# Patient Record
Sex: Female | Born: 1965 | Race: White | Hispanic: No | Marital: Married | Smoking: Never smoker
Health system: Southern US, Community
[De-identification: ages and names within clinical notes are randomized; demographics above are authoritative.]

## PROBLEM LIST (undated history)

## (undated) DIAGNOSIS — H6690 Otitis media, unspecified, unspecified ear: Secondary | ICD-10-CM

## (undated) HISTORY — DX: Otitis media, unspecified, unspecified ear: H66.90

---
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
## 1993-11-04 HISTORY — PX: TUBAL LIGATION: SHX77

## 1998-11-04 HISTORY — PX: LIPOSUCTION: SHX10

## 1999-11-05 HISTORY — PX: ABDOMINAL HYSTERECTOMY: SHX81

## 2010-05-11 ENCOUNTER — Ambulatory Visit: Payer: Self-pay | Admitting: Family Medicine

## 2010-05-11 DIAGNOSIS — H66009 Acute suppurative otitis media without spontaneous rupture of ear drum, unspecified ear: Secondary | ICD-10-CM | POA: Insufficient documentation

## 2010-05-11 DIAGNOSIS — H60339 Swimmer's ear, unspecified ear: Secondary | ICD-10-CM | POA: Insufficient documentation

## 2010-06-13 ENCOUNTER — Ambulatory Visit: Payer: Self-pay | Admitting: Emergency Medicine

## 2010-12-04 NOTE — Assessment & Plan Note (Signed)
Summary: EARACHE/KH   Vital Signs:  Patient Profile:   45 Years Old Female CC:      right ear pain X 3 days Height:     67 inches Weight:      146 pounds O2 Sat:      100 % O2 treatment:    Room Air Temp:     98.6 degrees F oral Pulse rate:   75 / minute Resp:     14 per minute BP sitting:   121 / 70  (right arm) Cuff size:   regular  Pt. in pain?   yes    Location:   right ear    Type:       sharp  Vitals Entered By: Lajean Saver, RN                    Updated Prior Medication List: No Medications Current Allergies: ! SULFAHistory of Present Illness Chief Complaint: right ear pain X 3 days History of Present Illness:  Subjective:  Patient complains of right earache for 3 days.  No other symptoms.  She has been swimming frequently.  REVIEW OF SYSTEMS Constitutional Symptoms      Denies fever, chills, night sweats, weight loss, weight gain, and fatigue.  Eyes       Denies change in vision, eye pain, eye discharge, glasses, contact lenses, and eye surgery. Ear/Nose/Throat/Mouth       Complains of ear pain.      Denies hearing loss/aids, change in hearing, ear discharge, dizziness, frequent runny nose, frequent nose bleeds, sinus problems, sore throat, hoarseness, and tooth pain or bleeding.      Comments: right ear Respiratory       Denies dry cough, productive cough, wheezing, shortness of breath, asthma, bronchitis, and emphysema/COPD.  Cardiovascular       Denies murmurs, chest pain, and tires easily with exhertion.    Gastrointestinal       Denies stomach pain, nausea/vomiting, diarrhea, constipation, blood in bowel movements, and indigestion. Genitourniary       Denies painful urination, kidney stones, and loss of urinary control. Neurological       Denies paralysis, seizures, and fainting/blackouts. Musculoskeletal       Denies muscle pain, joint pain, joint stiffness, decreased range of motion, redness, swelling, muscle weakness, and gout.  Skin        Denies bruising, unusual mles/lumps or sores, and hair/skin or nail changes.  Psych       Denies mood changes, temper/anger issues, anxiety/stress, speech problems, depression, and sleep problems.  Past History:  Past Medical History: Hx of bacteria to right ear causing blisters to ear drum- Seen by ENT  Past Surgical History: Tubal ligation 1994 Hysterectomy 2001  Family History: na  Social History: Married Never Smoked Alcohol use-no Drug use-no Smoking Status:  never Drug Use:  no   Objective:  No acute distress  Eyes:  Pupils are equal, round, and reactive to light and accomdation.  Extraocular movement is intact.  Conjunctivae are not inflamed.  Ears:  Left external auditory canal and tympanic membrane normal.  Right external auditory canal is erythematous and mildly edematous.  Right tympanic membrane is erythematous with decreased landmarks. Nose:  Minimal congestion.  No sinus tenderness Pharynx:  Normal  Neck:  Supple.  No adenopathy is present.   Assessment New Problems: OTITIS EXTERNA, ACUTE, RIGHT (ICD-380.12) OTITIS MEDIA, SUPPURATIVE, ACUTE, RIGHT (ICD-382.00)   Plan New Medications/Changes: CIPRODEX 0.3-0.1 % SUSP (CIPROFLOXACIN-DEXAMETHASONE) 4 gtts  in affected ear two times a day for 7 days  #7.5cc x 0, 05/11/2010, Donna Christen MD AMOXICILLIN 875 MG TABS (AMOXICILLIN) One by mouth two times a day  #20 x 0, 05/11/2010, Donna Christen MD  New Orders: New Patient Level III [phone] Planning Comments:   Begin oral amoxicillin and Ciprodex ear drops.  Avoid swimming until resolved. Follow-up with PCP if not resolving.   The patient and/or caregiver has been counseled thoroughly with regard to medications prescribed including dosage, schedule, interactions, rationale for use, and possible side effects and they verbalize understanding.  Diagnoses and expected course of recovery discussed and will return if not improved as expected or if the condition  worsens. Patient and/or caregiver verbalized understanding.  Prescriptions: CIPRODEX 0.3-0.1 % SUSP (CIPROFLOXACIN-DEXAMETHASONE) 4 gtts in affected ear two times a day for 7 days  #7.5cc x 0   Entered and Authorized by:   Donna Christen MD   Signed by:   Donna Christen MD on 05/11/2010   Method used:   Print then Give to Patient   RxID:   [id number] AMOXICILLIN 875 MG TABS (AMOXICILLIN) One by mouth two times a day  #20 x 0   Entered and Authorized by:   Donna Christen MD   Signed by:   Donna Christen MD on 05/11/2010   Method used:   Print then Give to Patient   RxID:   [id number]   Orders Added: 1)  New Patient Level III [07371]

## 2010-12-04 NOTE — Assessment & Plan Note (Signed)
Summary: T-dap Vaccine  Nurse Visit   Allergies: 1)  ! Sulfa  Immunizations Administered:  Tetanus Vaccine:    Vaccine Type: Tdap    Site: right deltoid    Mfr: Sanofi Pasteur    Dose: 0.5 ml    Route: IM    Given by: Lajean Saver RN    Exp. Date: 12/06/2011    Lot #: [id number]    VIS given: 09/22/07 version given June 13, 2010.  Orders Added: 1)  Tdap => 76yrs IM [90715] 2)  Admin 1st Vaccine [90471]   Immunizations Administered:  Tetanus Vaccine:    Vaccine Type: Tdap    Site: right deltoid    Mfr: Sanofi Pasteur    Dose: 0.5 ml    Route: IM    Given by: Lajean Saver RN    Exp. Date: 12/06/2011    Lot #: [id number]    VIS given: 09/22/07 version given June 13, 2010.  Vaccine Consent Questions for Tetanus:    Do you have a history of severe allergic reactions to this vaccine? no    Any prior history of allergic reactions to egg and/or gelatin? no    Do you currently have an acute febrile illness? no    Have you ever had a severe reaction to latex? no    Patient is moderately or severely ill? no    Vaccine information given and explained to patient? no    Are you currently pregnant? no  Appended Document: T-dap Vaccine Vaccine information given to and explained to patient. KL

## 2011-08-04 ENCOUNTER — Encounter: Payer: Self-pay | Admitting: Family Medicine

## 2011-08-09 ENCOUNTER — Encounter: Payer: Self-pay | Admitting: Family Medicine

## 2011-08-09 ENCOUNTER — Ambulatory Visit (INDEPENDENT_AMBULATORY_CARE_PROVIDER_SITE_OTHER): Payer: BC Managed Care – PPO | Admitting: Family Medicine

## 2011-08-09 VITALS — BP 136/83 | HR 87 | Ht 67.0 in | Wt 145.0 lb

## 2011-08-09 DIAGNOSIS — R030 Elevated blood-pressure reading, without diagnosis of hypertension: Secondary | ICD-10-CM

## 2011-08-09 DIAGNOSIS — L709 Acne, unspecified: Secondary | ICD-10-CM

## 2011-08-09 DIAGNOSIS — L708 Other acne: Secondary | ICD-10-CM

## 2011-08-09 MED ORDER — TRETINOIN 0.1 % EX CREA: TOPICAL_CREAM | Freq: Every day | CUTANEOUS | Status: AC

## 2011-08-09 NOTE — Patient Instructions (Signed)
Call when ready to check your cholesterol.

## 2011-08-09 NOTE — Progress Notes (Addendum)
Subjective:    Patient ID: Charlotte Bartlett, female    DOB: [birth year], 45 y.o.   MRN: [id number]  HPI  Had a rx for retin-A for her skin. She has very oily skin and breaks out. Would like a new rx  Has been really stressed at work. Says will need a work note for this today.  Says this has been disturbing your sleep.  Says it is a negative environment. She wanted a note to that she felt sick and she cannot work now diminishing her may not need to come to the doctor's office for actual care. If she doesn't have ordered and she will be denied payment for her sick day.  No prior hx of BP.   Review of Systems  Constitutional: Negative for fever, diaphoresis and unexpected weight change.  HENT: Negative for hearing loss, rhinorrhea and tinnitus.        Change in vision  Eyes: Negative for visual disturbance.  Respiratory: Negative for cough and wheezing.   Cardiovascular: Negative for chest pain and palpitations.  Gastrointestinal: Negative for nausea, vomiting, diarrhea and blood in stool.  Genitourinary: Negative for vaginal bleeding, vaginal discharge and difficulty urinating.  Musculoskeletal: Negative for myalgias and arthralgias.  Skin: Negative for rash.  Neurological: Negative for headaches.  Hematological: Negative for adenopathy. Does not bruise/bleed easily.  Psychiatric/Behavioral: Negative for sleep disturbance and dysphoric mood. The patient is not nervous/anxious.        BP 136/83  Pulse 87  Ht 5\' 7"  (1.702 m)  Wt 145 lb (65.772 kg)  BMI 22.71 kg/m2  SpO2 100%    Allergies  Allergen Reactions  . Sulfonamide Derivatives Rash    Past Medical History  Diagnosis Date  . Acute bacterial middle ear infection     blisters to eardrum    Past Surgical History  Procedure Date  . Tubal ligation 1995  . Abdominal hysterectomy 2001    Partial   . Ganglion cyst excision 1980    Left   . Liposuction 2000    History   Social History  . Marital Status: Married      Spouse Name: Charlotte Bartlett    Number of Children: 2   . Years of Education: N/A   Occupational History  . Teacher/model [employer]    Social History Main Topics  . Smoking status: Never Smoker   . Smokeless tobacco: Not on file  . Alcohol Use: No  . Drug Use: No  . Sexually Active: Yes     married   Other Topics Concern  . Not on file   Social History Narrative   Regular exercise. 2 caffeine drinks per day.     Family History  Problem Relation Age of Onset  . Breast cancer Maternal Grandmother     Ms. Dickison does not currently have medications on file.  Objective:   Physical Exam  Constitutional: She is oriented to person, place, and time. She appears well-developed and well-nourished.  HENT:  Head: Normocephalic and atraumatic.  Eyes: Conjunctivae are normal. Pupils are equal, round, and reactive to light.  Neck: Neck supple. No thyromegaly present.  Cardiovascular: Normal rate, regular rhythm and normal heart sounds.   Pulmonary/Chest: Effort normal and breath sounds normal.  Musculoskeletal: She exhibits no edema.  Lymphadenopathy:    She has no cervical adenopathy.  Neurological: She is alert and oriented to person, place, and time.  Skin: Skin is warm and dry.  She does have large pores a few fine papules on her face. No cystic lesions. Overall her skin looks fairly good today. No significant sun damage  Psychiatric: She has a normal mood and affect. Her behavior is normal.          Assessment & Plan:  Elevated BP - 3 recheck her blood pressure was a little bit better. We will keep an eye on this. Encouraged her to a low salt diet. Chart he exercises regularly.    Acne - Will refil lher Retin-A. Let her know may need ot be prior authorized so recommend call pharm first to see if ready.   She is due for cholesterol screen as well. Last check was 5 years ago. She wasn't to hold on this for now. Says when check in the  summer when has time off from school.   Sick time-her know that usually we don't write notes for work unless they are seen but in this case I do understand the difficult situation that she is in and as long as it is not frequent albuterol and to write a note saying that she did contact her office and that she reported that she was out sick.

## 2011-08-28 ENCOUNTER — Telehealth: Payer: Self-pay | Admitting: Family Medicine

## 2011-08-28 NOTE — Telephone Encounter (Signed)
Pt called and said her retin A has to be prior authorized and she is calling to find out where we are with that. Plan:  Told pt do not see any notes in the chart where it is saying prior auth done.  Will have to ask clinical staff and or call the pharm to get them to send the prior auth form. Jarvis Newcomer, LPN [facility]

## 2011-08-29 ENCOUNTER — Telehealth: Payer: Self-pay | Admitting: Family Medicine

## 2011-08-29 NOTE — Telephone Encounter (Signed)
Called [facility] for prior auth for medication (retin A) 0.1% cream. Plan:  Spoke with [facility] prior [employer].  Prior auth approval obtained starting 08-08-2011----08-28-2012.  Case # [id number].  Faxed prior approval to [facility].  Pt's husband notified that prior auth approved for the tretinoin 1% cream. Jarvis Newcomer, LPN [facility]

## 2012-08-27 ENCOUNTER — Ambulatory Visit: Payer: BC Managed Care – PPO | Admitting: Family Medicine

## 2013-04-01 ENCOUNTER — Ambulatory Visit (INDEPENDENT_AMBULATORY_CARE_PROVIDER_SITE_OTHER): Payer: BC Managed Care – PPO | Admitting: Family Medicine

## 2013-04-01 ENCOUNTER — Encounter: Payer: Self-pay | Admitting: Family Medicine

## 2013-04-01 ENCOUNTER — Telehealth: Payer: Self-pay | Admitting: *Deleted

## 2013-04-01 ENCOUNTER — Ambulatory Visit (INDEPENDENT_AMBULATORY_CARE_PROVIDER_SITE_OTHER): Payer: BC Managed Care – PPO

## 2013-04-01 VITALS — BP 112/69 | HR 91 | Temp 98.6°F | Wt 137.0 lb

## 2013-04-01 DIAGNOSIS — R1031 Right lower quadrant pain: Secondary | ICD-10-CM

## 2013-04-01 DIAGNOSIS — R1032 Left lower quadrant pain: Secondary | ICD-10-CM

## 2013-04-01 DIAGNOSIS — N839 Noninflammatory disorder of ovary, fallopian tube and broad ligament, unspecified: Secondary | ICD-10-CM

## 2013-04-01 DIAGNOSIS — R1900 Intra-abdominal and pelvic swelling, mass and lump, unspecified site: Secondary | ICD-10-CM

## 2013-04-01 DIAGNOSIS — R188 Other ascites: Secondary | ICD-10-CM

## 2013-04-01 DIAGNOSIS — R300 Dysuria: Secondary | ICD-10-CM

## 2013-04-01 LAB — CBC WITH DIFFERENTIAL/PLATELET
Basophils Absolute: 0 10*3/uL (ref 0.0–0.1)
Lymphocytes Relative: 11 % — ABNORMAL LOW (ref 12–46)
Lymphs Abs: 1.4 10*3/uL (ref 0.7–4.0)
MCV: 95.4 fL (ref 78.0–100.0)
Neutro Abs: 10.6 10*3/uL — ABNORMAL HIGH (ref 1.7–7.7)
Neutrophils Relative %: 78 % — ABNORMAL HIGH (ref 43–77)
Platelets: 195 10*3/uL (ref 150–400)
RBC: 4.62 MIL/uL (ref 3.87–5.11)
WBC: 13.6 10*3/uL — ABNORMAL HIGH (ref 4.0–10.5)

## 2013-04-01 LAB — POCT URINALYSIS DIPSTICK
Blood, UA: NEGATIVE
Glucose, UA: NEGATIVE
Ketones, UA: NEGATIVE
Spec Grav, UA: 1.015
Urobilinogen, UA: 0.2

## 2013-04-01 MED ORDER — IOHEXOL 300 MG/ML  SOLN
100.0000 mL | Freq: Once | INTRAMUSCULAR | Status: AC | PRN
  Administered 2013-04-01: 100 mL via INTRAVENOUS

## 2013-04-01 MED ORDER — HYDROCODONE-ACETAMINOPHEN 5-325 MG PO TABS: 1.0000 | ORAL_TABLET | Freq: Four times a day (QID) | ORAL | Status: DC | PRN

## 2013-04-01 NOTE — Progress Notes (Signed)
CC: [name] is a 47 y.o. female is here for lower abd pain   Subjective: HPI:  Patient complains of pain in the lower abdomen that has been present since Monday of this week worsening on a daily basis moderate to severe upon waking this morning. Localized at the [facility] and left lower quadrant and right lower quadrant. Associated with a fullness. Described as a burning sensation that radiates into the back. She has some dysuria the past 24 hours but pain is still present without voiding. Pain is worse when crunching or twisting, hitting potholes in the car, shaking her stomach, or pressing on the lower abdomen. She reports bowels are still moving once a day but have become much more loose without blood or tar-like appearance. She feels fatigued but denies chills or fevers, she does admit to nausea and significant appetite loss.  Nothing makes pain better or worse other than above, pain is not related to bowel movements habits. Symptoms are present all hours of the day somewhat interfere with sleep. History of hysterectomy with no recent vaginal bleeding or discharge nor irritation.  Denies abdominal pain elsewhere, flank pain, change in the odor color or consistency of urine, lymph node swelling, bruising, shortness of breath, cough, chest pain, nor family history of inflammatory bowel disease    Review Of Systems Outlined In HPI  Past Medical History  Diagnosis Date  . Acute bacterial middle ear infection     blisters to eardrum     Family History  Problem Relation Age of Onset  . Breast cancer Maternal Grandmother      History  Substance Use Topics  . Smoking status: Never Smoker   . Smokeless tobacco: Not on file  . Alcohol Use: No     Objective: Filed Vitals:   04/01/13 0912  BP: 112/69  Pulse: 91  Temp: 98.6 F (37 C)    General: Alert and Oriented, No Acute Distress HEENT: Pupils equal, round, reactive to light. Conjunctivae clear.  Moist mucous  membranes Lungs: Clear to auscultation bilaterally, no wheezing/ronchi/rales.  Comfortable work of breathing. Good air movement. Cardiac: Regular rate and rhythm. Normal S1/S2.  No murmurs, rubs, nor gallops.   Abdomen: Soft without palpable masses. There is guarding with palpation of navel and right and left lower quadrant, she has rebound with palpation of right and left lower quadrant, heel strike reproduces pain, psoas sign negative on the right. Normal bowel sounds Extremities: No peripheral edema.  Strong peripheral pulses.  Mental Status: No depression, anxiety, nor agitation. Skin: Warm and dry.  Assessment & Plan: Charlotte Bartlett was seen today for lower abd pain.  Diagnoses and associated orders for this visit:  Dysuria - Urinalysis Dipstick - Urine Culture  Right lower quadrant pain - CT Abdomen Pelvis W Contrast; Future - CBC with Differential  Abdominal pain, left lower quadrant - CT Abdomen Pelvis W Contrast; Future - CBC with Differential    Discussed with patient that given rebound and guarding there is a concern for diverticulitis versus appendicitis versus colitis that would warrant CT scan today to rule out. She will be having a scan done at 12:00 this afternoon. Urinalysis is normal with low suspicion of genitourinary etiology pain  Return if symptoms worsen or fail to improve.

## 2013-04-01 NOTE — Telephone Encounter (Signed)
Prior auth obtained for CT abdomin and pelvis with contrast.# is [id number]

## 2013-04-01 NOTE — Progress Notes (Addendum)
Discussed with patient face to face CT findings worrisome for ovarian cancer, counseling provided will attempt to arrange OncGYN ASAP.  [facility] expects appt sometime mid-march, their office is looking into earlier availability at [facility] location.  45 minutes spent in face-to-face visit today of which at least 50% was counseling or coordinating care right and left lower quadrant abdominal pain, pelvic mass.

## 2013-04-01 NOTE — Addendum Note (Signed)
Addended by: Laren Boom on: 04/01/2013 01:44 PM   Modules accepted: Orders, Level of Service

## 2013-04-01 NOTE — Addendum Note (Signed)
Addended by: Laren Boom on: 04/01/2013 01:51 PM   Modules accepted: Orders

## 2013-04-01 NOTE — Telephone Encounter (Signed)
Melissa from gyn/oncology called and states they have an appt set up for pt at [facility] at 845 am 04-09-13. With Dr. Laurette Schimke. Called and notified pt of this appt time and date.Found the physician on [facility] website and gave her phone number [phone].

## 2013-04-03 LAB — URINE CULTURE

## 2013-04-05 ENCOUNTER — Telehealth: Payer: Self-pay | Admitting: *Deleted

## 2013-04-05 NOTE — Telephone Encounter (Signed)
Pt called she was wanting the results of her ct to be faxed to Dr. Nelly Rout at [facility]. She has an appt on Friday this was faxed to [phone]. Pt would like a copy sent to her also.Loralee Pacas [place]

## 2013-04-06 ENCOUNTER — Telehealth: Payer: Self-pay | Admitting: Family Medicine

## 2013-04-06 NOTE — Telephone Encounter (Signed)
I was called by Dr. Charline Bills the radiologist who read Charlotte Bartlett's CT scan from last week.  It sounds like after I sent her the radiology report she did some research online and wanted some clarification from him.  As I had told her during her visit and then the following Friday when she wanted some clarification, he communicated that the findings were worrisome for ovarian cancer with peritoneal metastasis. Dr. Rito Ehrlich was concerned that she was not taking this news well and asked that someone from our office look into her wellbeing.  Sue Lush, Will you please check in with Charlotte Bartlett to see if there's anything she needs between now and her oncology appointment this Friday.  I could offer her a Research scientist (physical sciences) referral to our colleagues downstairs if she'd like to help with some of the emotions she might be going through.

## 2013-04-06 NOTE — Telephone Encounter (Addendum)
Called home phone number and pt's husband answered the phone. I spoke with him briefly and asked how pt was feeling and if we could be of any assistance in offering her counseling. The husband states they are fine for now. I will try to reach the pt herself in a few min on her cell phone; called and spoke with pt and asked if there was anything that she needed between now and office visit and offered her counseling. Pt declined any counseling at this point but was very appreciative.Asked her to feel free to call back if she changed her mind.

## 2013-04-19 ENCOUNTER — Telehealth: Payer: Self-pay | Admitting: *Deleted

## 2013-04-19 NOTE — Telephone Encounter (Signed)
Pt called to let Dr. Ivan Anchors know that she saw the cancer specialist and apparently the radiologist was wrong. The specialist said that pt has fibroids and not stage four ovarian cancer. Pt wanted to express to you her gratitude because you were very caring. She states she was very pleased with the process of getting her in with the specialist and she was very impressed with the specialist she saw. She states she is now at home recovering, but I didn't ask if they did a bx or not.Pt wanted you to be aware of this.

## 2014-06-16 IMAGING — CT CT ABD-PELV W/ CM
1 of 5 series · 13 of 36 positions shown, 18 images · IV contrast (omnipaque)
Comparison: None.

CLINICAL DATA: Lower abdominal pain, bloating, prior hysterectomy

CT ABDOMEN AND PELVIS WITH CONTRAST
TECHNIQUE: Multidetector CT imaging of the abdomen and pelvis was
performed following the standard protocol during bolus
administration of intravenous contrast.
Contrast: 100mL OMNIPAQUE IOHEXOL 300 MG/ML  SOLN

[Series 2: abd/pelvis with · axial · 0.70mm/px · z∈[-437,-97]mm · 13 of 78 slices shown, 18 images]
[im 5/78  soft-tissue]
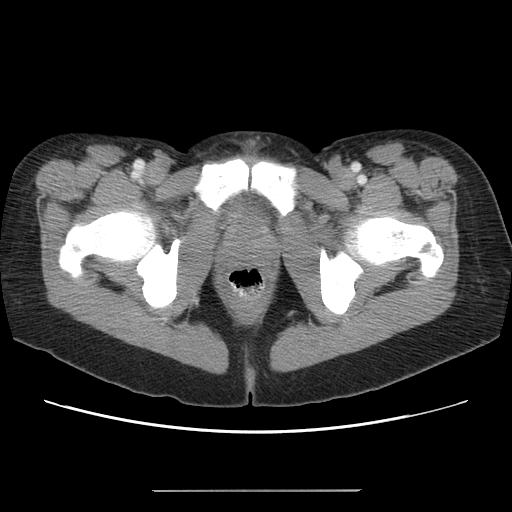
[im 5/78  bone]
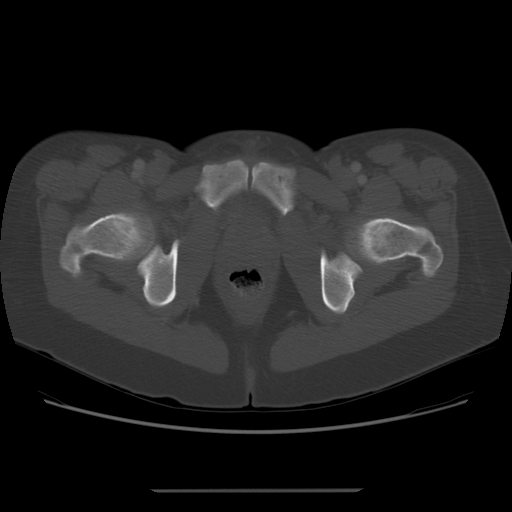
[im 10/78  soft-tissue]
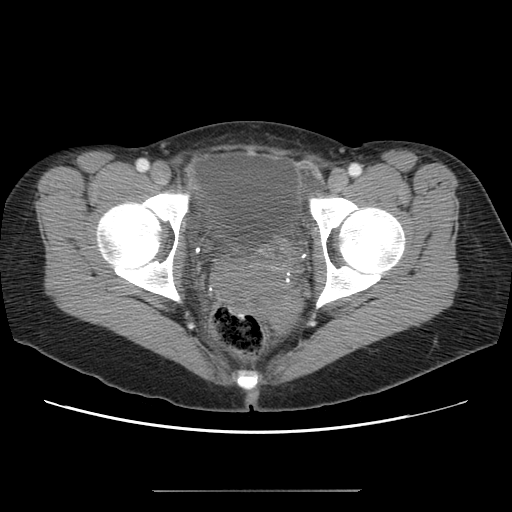
[im 20/78  soft-tissue]
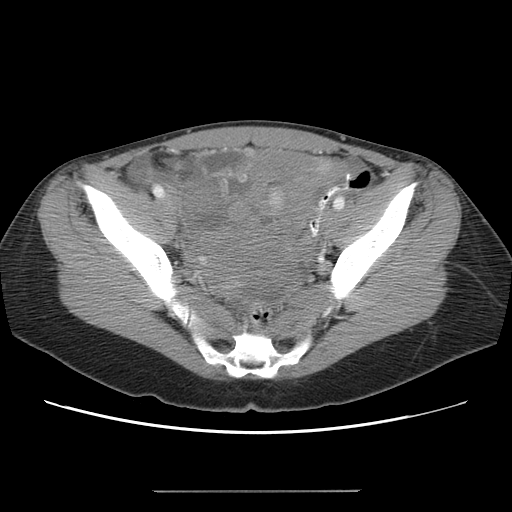
[im 25/78  soft-tissue]
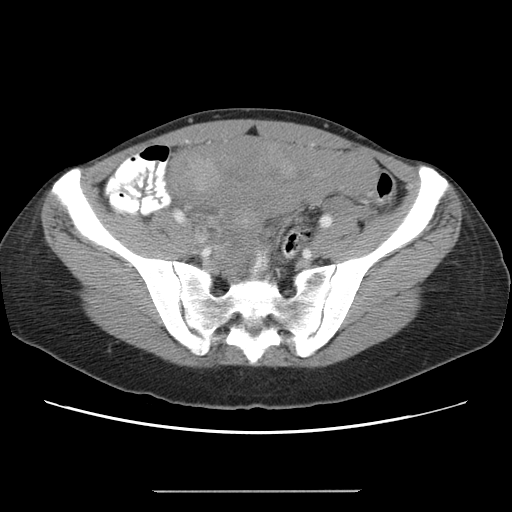
[im 29/78  soft-tissue]
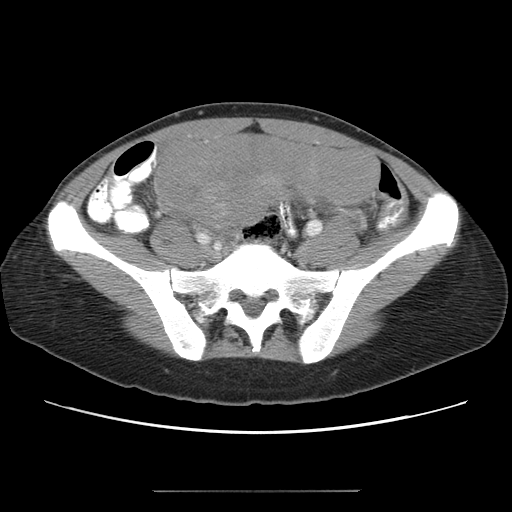
[im 34/78  soft-tissue]
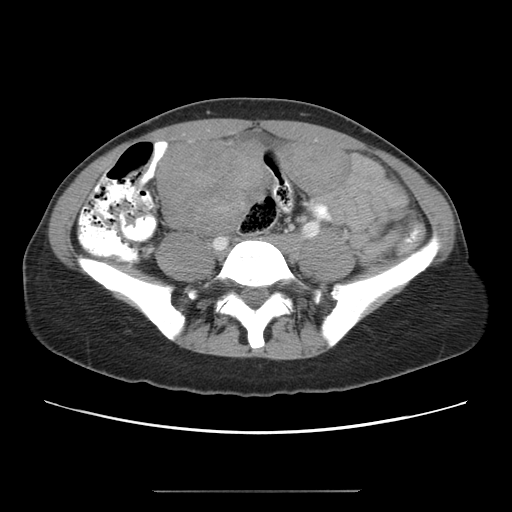
[im 44/78  soft-tissue]
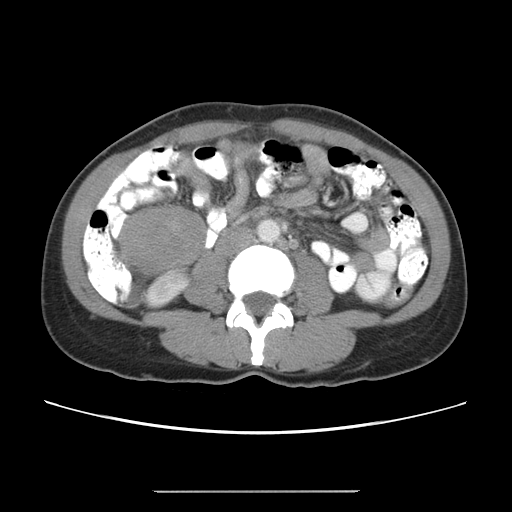
[im 49/78  soft-tissue]
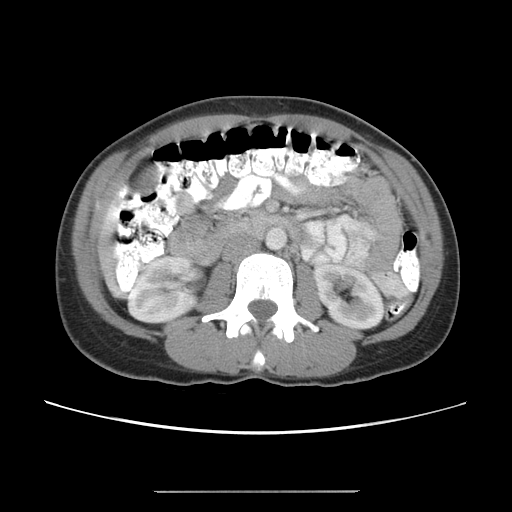
[im 53/78  soft-tissue]
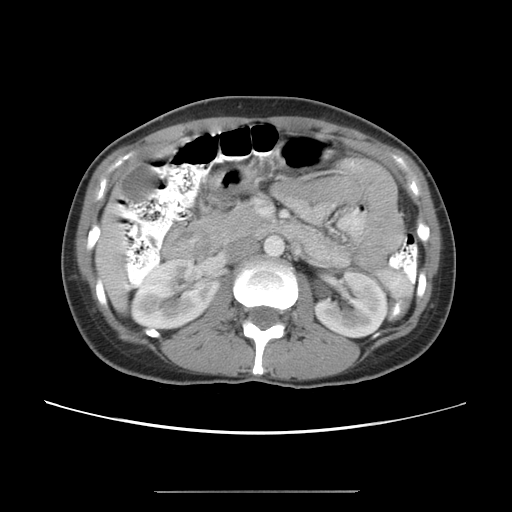
[im 53/78  bone]
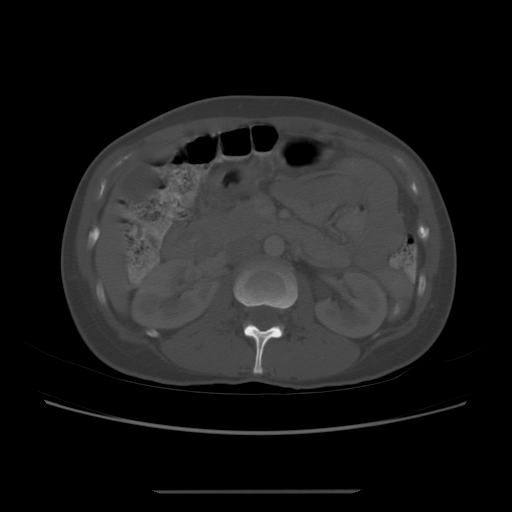
[im 58/78  soft-tissue]
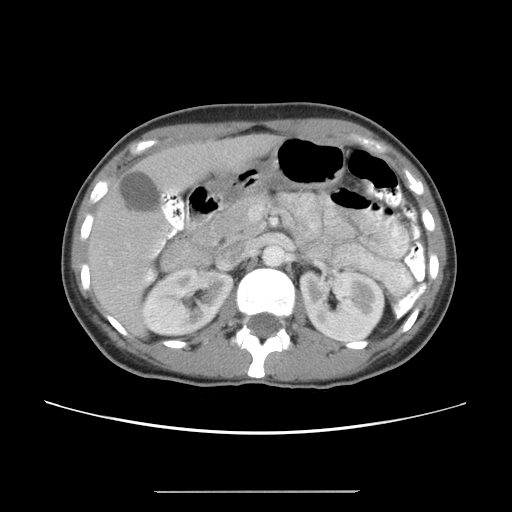
[im 58/78  lung]
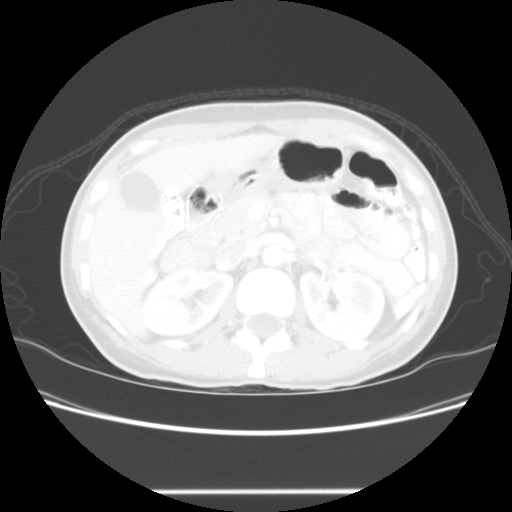
[im 63/78  lung]
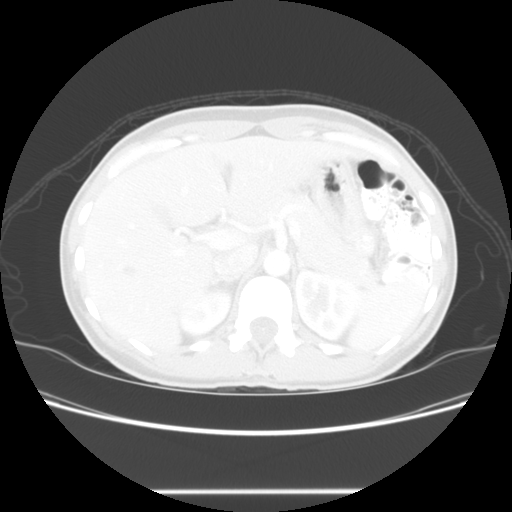
[im 68/78  soft-tissue]
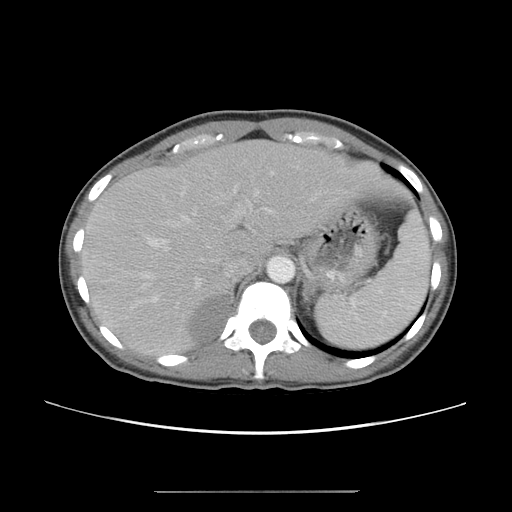
[im 68/78  lung]
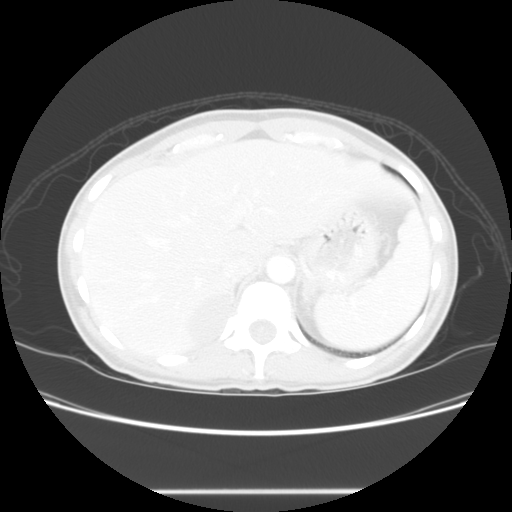
[im 73/78  soft-tissue]
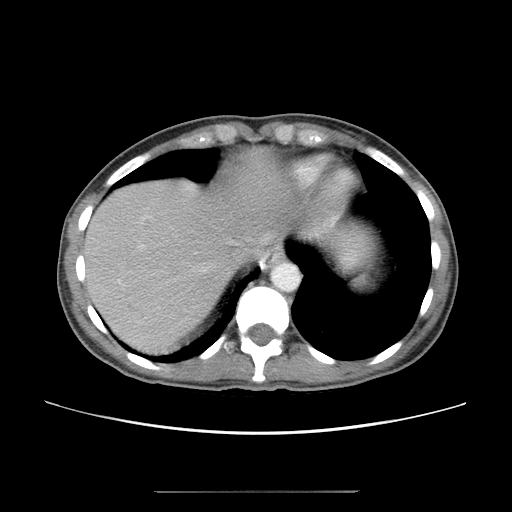
[im 73/78  lung]
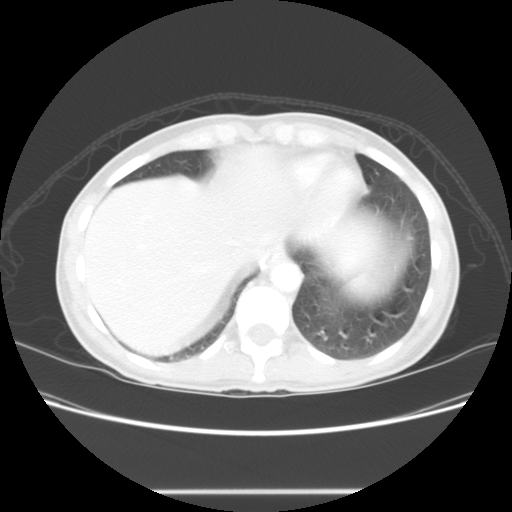

[13 of 36 positions shown; findings below may reference images not displayed]

FINDINGS: Lung bases are clear.

Liver is notable for an 8 x 12 mm fluid density lesion/cyst in the
right hepatic lobe (series 2/image 17) and additional probable cyst
in the right hepatic lobe (series 2/image 12).

4.3 x 3.0 cm capsular lesion along the posterior right hepatic lobe
(series 2/image 13), favored to reflect a peritoneal implant.

Spleen, pancreas, and adrenal glands are within normal limits.

Gallbladder is unremarkable.  No intrahepatic or extrahepatic
ductal dilatation.

Kidneys are within normal limits.  No hydronephrosis.

No evidence of bowel obstruction.  Normal appendix.

No evidence of abdominal aortic aneurysm.

Multiple bulky masses in the abdomen/pelvis.  For example:
--4.7 x 6.2 x 5.1 cm peritoneal implant in the right mid abdomen
(series 2/image 35)
--6.5 x 15.7 x 11.2 cm peritoneal/omental mass in the anterior
abdomen (series 2/image 52)
--8.6 x 9.7 cm aggregate mass in the dependent pelvis (series
2/image 65)

Associated small volume pelvic ascites.

Bilateral adnexal/ovarian masses are suspected, measuring 4.5 x
cm on the right (series 2/image 62) and 3.7 x 3.3 cm on the left
(series 2/image 63).

Prior hysterectomy.

Bladder is within normal limits.

Visualized osseous structures are within normal limits.
IMPRESSION: Bilateral adnexal/ovarian masses, measuring up to 4.5 cm.

Bulky peritoneal/omental disease in the abdomen/pelvis, as
described above.

Small volume pelvic ascites.

Overall appearance is worrisome for metastatic ovarian cancer with
widespread peritoneal metastases.  Primary peritoneal
carcinomatosis or peritoneal metastases from a nonvisualized
primary (such as breast cancer) are also possible but less likely.

These results were called by telephone on 04/01/2013 at 1719 hours
to Dr Amishadeka Oromiyya, who verbally acknowledged these results.

## 2016-07-12 ENCOUNTER — Encounter: Payer: Self-pay | Admitting: Family Medicine

## 2016-07-12 ENCOUNTER — Ambulatory Visit (INDEPENDENT_AMBULATORY_CARE_PROVIDER_SITE_OTHER): Payer: BC Managed Care – PPO | Admitting: Family Medicine

## 2016-07-12 VITALS — BP 120/61 | HR 58 | Wt 151.0 lb

## 2016-07-12 DIAGNOSIS — H6092 Unspecified otitis externa, left ear: Secondary | ICD-10-CM

## 2016-07-12 DIAGNOSIS — H9202 Otalgia, left ear: Secondary | ICD-10-CM

## 2016-07-12 MED ORDER — CIPROFLOXACIN-DEXAMETHASONE 0.3-0.1 % OT SUSP: 4.0000 [drp] | Freq: Two times a day (BID) | OTIC | 0 refills | Status: AC

## 2016-07-12 NOTE — Progress Notes (Signed)
   Subjective:    Patient ID: Charlotte Bartlett, female    DOB: 04/05/1966, 50 y.o.   MRN: [id number]  HPI Here for left otalgia x 2 weeks.  Using debrox and alcohol to try to clear her ears out.  No post nasal drip but feels like her ear is full.  No fever or recent URI sxs. Says feels similar to when had swimmers ear a few years ago.  Has been using Q-tips as well.    Review of Systems     Objective:   Physical Exam  Constitutional: She is oriented to person, place, and time. She appears well-developed and well-nourished.  HENT:  Head: Normocephalic and atraumatic.  Right Ear: External ear normal.  Left Ear: External ear normal.  Nose: Nose normal.  Mouth/Throat: Oropharynx is clear and moist.  Both TMs blocked by cerumen.  Right canal - curretage used to remove a large ball of wax.  Unable to successfully curettage the left ear so switched to irrigation. TM And canal on the right ear are normal. After irrigation the left TM and canal were cleared out. There was some erythema in the canal up a little bit a white debris consistent with otitis externa.  Eyes: Conjunctivae and EOM are normal. Pupils are equal, round, and reactive to light.  Neck: Neck supple. No thyromegaly present.  Lymphadenopathy:    She has no cervical adenopathy.  Neurological: She is alert and oriented to person, place, and time.  Skin: Skin is warm and dry.  Psychiatric: She has a normal mood and affect.       Assessment & Plan:  Left otalgia -  After removal of cerumen with irrigation she did have an erythematous area with a little bit of white debris. We'll go ahead and treat for otitis externa. Ciprodex drops called into the pharmacy. Call if not better in one week. Avoid excess water exposure to the ear.

## 2018-06-17 ENCOUNTER — Emergency Department (INDEPENDENT_AMBULATORY_CARE_PROVIDER_SITE_OTHER)
Admission: EM | Admit: 2018-06-17 | Discharge: 2018-06-17 | Disposition: A | Discharge status: Home / Self Care | Payer: BC Managed Care – PPO | Source: Home / Self Care

## 2018-06-17 ENCOUNTER — Encounter: Payer: Self-pay | Admitting: Emergency Medicine

## 2018-06-17 ENCOUNTER — Other Ambulatory Visit: Payer: Self-pay

## 2018-06-17 DIAGNOSIS — Z111 Encounter for screening for respiratory tuberculosis: Secondary | ICD-10-CM | POA: Diagnosis not present

## 2018-06-17 NOTE — ED Triage Notes (Signed)
Patient presents for PPD placement for work. She states she did have a positive reaction in distant past; cannot recall follow up. Explained it could be positive again and follow up xray/visit might be necessary. Offered her postponing PPD until she can be seen by PCP or in [facility] Well[facility], but she has to start process before upcoming weekend.

## 2018-06-20 ENCOUNTER — Emergency Department (INDEPENDENT_AMBULATORY_CARE_PROVIDER_SITE_OTHER)
Admission: EM | Admit: 2018-06-20 | Discharge: 2018-06-20 | Disposition: A | Discharge status: Home / Self Care | Payer: BC Managed Care – PPO | Source: Home / Self Care | Attending: Family Medicine | Admitting: Family Medicine

## 2018-06-20 ENCOUNTER — Other Ambulatory Visit: Payer: Self-pay

## 2018-06-20 DIAGNOSIS — Z111 Encounter for screening for respiratory tuberculosis: Secondary | ICD-10-CM

## 2018-06-20 LAB — TB SKIN TEST: TB Skin Test: POSITIVE

## 2018-06-20 NOTE — ED Triage Notes (Signed)
Here today for TB test read

## 2018-06-24 NOTE — ED Provider Notes (Signed)
[facility]    CSN: [id number] Arrival date & time: 06/20/18  0917     History   Chief Complaint Chief Complaint  Patient presents with  . PPD Reading    HPI Charlotte Bartlett is a 52 y.o. female.   Patient presents for reading of PPD test.  She is completely assymptomatic at present      Past Medical History:  Diagnosis Date  . Acute bacterial middle ear infection    blisters to eardrum    Patient Active Problem List   Diagnosis Date Noted  . Acne 08/09/2011    Past Surgical History:  Procedure Laterality Date  . ABDOMINAL HYSTERECTOMY  2001   Partial   . GANGLION CYST EXCISION  1980   Left   . LIPOSUCTION  2000  . TUBAL LIGATION  1995    OB History   None      Home Medications    Prior to Admission medications   Medication Sig Start Date End Date Taking? Authorizing Provider  ciprofloxacin-dexamethasone (CIPRODEX) otic suspension Place 4 drops into the left ear 2 (two) times daily. X 1 week 07/12/16   Agapito GamesMetheney, Catherine D, MD  MINIVELLE 0.025 MG/24HR  06/21/16   [provider]  Multiple Vitamin (MULTIVITAMIN PO) Take by mouth.      [provider]    Family History Family History  Problem Relation Age of Onset  . Breast cancer Maternal Grandmother     Social History Social History   Tobacco Use  . Smoking status: Never Smoker  . Smokeless tobacco: Never Used  Substance Use Topics  . Alcohol use: No  . Drug use: No     Allergies   Sulfonamide derivatives   Review of Systems Review of Systems  Constitutional: Negative.   Respiratory: Negative.   Cardiovascular: Negative.   All other systems reviewed and are negative.    Physical Exam Triage Vital Signs ED Triage Vitals  Enc Vitals Group     BP 06/20/18 0930 124/84     Pulse Rate 06/20/18 0930 70     Resp --      Temp --      Temp src --      SpO2 06/20/18 0930 100 %     Weight --      Height --      Head Circumference --      Peak  Flow --      Pain Score 06/20/18 0932 0     Pain Loc --      Pain Edu? --      Excl. in GC? --    No data found.  Updated Vital Signs BP 124/84 (BP Location: Right Arm)   Pulse 70   SpO2 100%   Visual Acuity Right Eye Distance:   Left Eye Distance:   Bilateral Distance:    Right Eye Near:   Left Eye Near:    Bilateral Near:     Physical Exam PPD 9mm induration  UC Treatments / Results  Labs (all labs ordered are listed, but only abnormal results are displayed) Labs Reviewed - No data to display  EKG None  Radiology No results found.  Procedures Procedures (including critical care time)  Medications Ordered in UC Medications - No data to display  Initial Impression / Assessment and Plan / UC Course  I have reviewed the triage vital signs and the nursing notes.  Pertinent labs & imaging results that were available during my care  of the patient were reviewed by me and considered in my medical decision making (see chart for details).    PPD 9mm induration:  negative   Final Clinical Impressions(s) / UC Diagnoses   Final diagnoses:  Visit for TB skin test   Discharge Instructions   None    ED Prescriptions    None        Lattie HawBeese, Ezabella Teska A, MD 06/24/18 1209
